# Patient Record
Sex: Male | Born: 1980 | Hispanic: No | Marital: Married | Smoking: Never smoker
Health system: Southern US, Community
[De-identification: ages and names within clinical notes are randomized; demographics above are authoritative.]

## PROBLEM LIST (undated history)

## (undated) DIAGNOSIS — J45909 Unspecified asthma, uncomplicated: Secondary | ICD-10-CM

## (undated) HISTORY — DX: Unspecified asthma, uncomplicated: J45.909

## (undated) HISTORY — PX: FEMUR FRACTURE SURGERY: SHX633

---

## 2017-09-09 ENCOUNTER — Encounter: Payer: Self-pay | Admitting: Nurse Practitioner

## 2017-09-09 ENCOUNTER — Ambulatory Visit (INDEPENDENT_AMBULATORY_CARE_PROVIDER_SITE_OTHER): Payer: Managed Care, Other (non HMO) | Admitting: Nurse Practitioner

## 2017-09-09 VITALS — BP 138/92 | HR 90 | Temp 98.1°F | Ht 69.0 in | Wt 265.0 lb

## 2017-09-09 DIAGNOSIS — R748 Abnormal levels of other serum enzymes: Secondary | ICD-10-CM

## 2017-09-09 DIAGNOSIS — I10 Essential (primary) hypertension: Secondary | ICD-10-CM | POA: Insufficient documentation

## 2017-09-09 DIAGNOSIS — Z6839 Body mass index (BMI) 39.0-39.9, adult: Secondary | ICD-10-CM

## 2017-09-09 DIAGNOSIS — R030 Elevated blood-pressure reading, without diagnosis of hypertension: Secondary | ICD-10-CM

## 2017-09-09 DIAGNOSIS — R7989 Other specified abnormal findings of blood chemistry: Secondary | ICD-10-CM

## 2017-09-09 DIAGNOSIS — E781 Pure hyperglyceridemia: Secondary | ICD-10-CM

## 2017-09-09 DIAGNOSIS — Z0001 Encounter for general adult medical examination with abnormal findings: Secondary | ICD-10-CM | POA: Diagnosis not present

## 2017-09-09 DIAGNOSIS — E559 Vitamin D deficiency, unspecified: Secondary | ICD-10-CM

## 2017-09-09 DIAGNOSIS — E118 Type 2 diabetes mellitus with unspecified complications: Secondary | ICD-10-CM

## 2017-09-09 DIAGNOSIS — E669 Obesity, unspecified: Secondary | ICD-10-CM

## 2017-09-09 LAB — COMPREHENSIVE METABOLIC PANEL
ALT: 82 U/L — AB (ref 0–53)
AST: 40 U/L — ABNORMAL HIGH (ref 0–37)
Albumin: 4.5 g/dL (ref 3.5–5.2)
Alkaline Phosphatase: 77 U/L (ref 39–117)
BILIRUBIN TOTAL: 0.8 mg/dL (ref 0.2–1.2)
BUN: 12 mg/dL (ref 6–23)
CO2: 31 meq/L (ref 19–32)
Calcium: 9 mg/dL (ref 8.4–10.5)
Chloride: 98 mEq/L (ref 96–112)
Creatinine, Ser: 0.93 mg/dL (ref 0.40–1.50)
GFR: 96.27 mL/min (ref >60.00)
GLUCOSE: 260 mg/dL — AB (ref 70–99)
Potassium: 4 mEq/L (ref 3.5–5.1)
SODIUM: 136 meq/L (ref 135–145)
TOTAL PROTEIN: 7.6 g/dL (ref 6.0–8.3)

## 2017-09-09 LAB — LIPID PANEL
Cholesterol: 152 mg/dL (ref 0–200)
HDL: 31.7 mg/dL — AB (ref >39.00)
NonHDL: 120.58
TRIGLYCERIDES: 328 mg/dL — AB (ref 0.0–149.0)
Total CHOL/HDL Ratio: 5
VLDL: 65.6 mg/dL — ABNORMAL HIGH (ref 0.0–40.0)

## 2017-09-09 LAB — TSH: TSH: 4.53 u[IU]/mL — ABNORMAL HIGH (ref 0.35–4.50)

## 2017-09-09 LAB — VITAMIN D 25 HYDROXY (VIT D DEFICIENCY, FRACTURES): VITD: 10.91 ng/mL — ABNORMAL LOW (ref 30.00–100.00)

## 2017-09-09 LAB — LDL CHOLESTEROL, DIRECT: LDL DIRECT: 81 mg/dL

## 2017-09-09 LAB — HEMOGLOBIN A1C: HEMOGLOBIN A1C: 9.1 % — AB (ref 4.6–6.5)

## 2017-09-09 NOTE — Patient Instructions (Addendum)
Check BP at home (once every morning) at record. Bring BP reading to next office visit.  Call office sooner if BP > 150/90 for more than 24hrs.  Maintain Dash diet and regular exercise for weight loss and to lower BP.  Continue to hold Ritalin at this time.  Labs indicates Diabetes with HgbA1c of 9.1. Need to start Metformin. Rx sent. TSH is  Mildly elevated. Need to repeat in 4weeks. Vitamin D is very low. Sent Rx for vitamin D supplement. CMP indicates normal renal function, but mild elevation in liver enzymes. Lipid panel indicates very elevated triglyceride. Sent fenofibrate Rx. Need to also maintain low fat and low carb diet, start regular exercise. Return to office as discussed  [name] stands for "Dietary Approaches to Stop Hypertension." The DASH eating plan is a healthy eating plan that has been shown to reduce high blood pressure (hypertension). It may also reduce your risk for type 2 diabetes, heart disease, and stroke. The DASH eating plan may also help with weight loss. What are tips for following this plan? General guidelines  Avoid eating more than 2,300 mg (milligrams) of salt (sodium) a day. If you have hypertension, you may need to reduce your sodium intake to 1,500 mg a day.  Limit alcohol intake to no more than 1 drink a day for nonpregnant women and 2 drinks a day for men. One drink equals 12 oz of beer, 5 oz of wine, or 1 oz of hard liquor.  Work with your health care provider to maintain a healthy body weight or to lose weight. Ask what an ideal weight is for you.  Get at least 30 minutes of exercise that causes your heart to beat faster (aerobic exercise) most days of the week. Activities may include walking, swimming, or biking.  Work with your health care provider or diet and nutrition specialist (dietitian) to adjust your eating plan to your individual calorie needs. Reading food labels  Check food labels for the amount of sodium per serving.  Choose foods with less than 5 percent of the Daily Value of sodium. Generally, foods with less than 300 mg of sodium per serving fit into this eating plan.  To find whole grains, look for the word "whole" as the first word in the ingredient list. Shopping  Buy products labeled as "low-sodium" or "no salt added."  Buy fresh foods. Avoid canned foods and premade or frozen meals. Cooking  Avoid adding salt when cooking. Use salt-free seasonings or herbs instead of table salt or sea salt. Check with your health care provider or pharmacist before using salt substitutes.  Do not fry foods. Cook foods using healthy methods such as baking, boiling, grilling, and broiling instead.  Cook with heart-healthy oils, such as olive, canola, soybean, or sunflower oil. Meal planning   Eat a balanced diet that includes: ? 5 or more servings of fruits and vegetables each day. At each meal, try to fill half of your plate with fruits and vegetables. ? Up to 6-8 servings of whole grains each day. ? Less than 6 oz of lean meat, poultry, or fish each day. A 3-oz serving of meat is about the same size as a deck of cards. One egg equals 1 oz. ? 2 servings of low-fat dairy each day. ? A serving of nuts, seeds, or beans 5 times each week. ? Heart-healthy fats. Healthy fats called Omega-3 fatty acids are found in foods such as flaxseeds and coldwater fish, like sardines, salmon,  and mackerel.  Limit how much you eat of the following: ? Canned or prepackaged foods. ? Food that is high in trans fat, such as fried foods. ? Food that is high in saturated fat, such as fatty meat. ? Sweets, desserts, sugary drinks, and other foods with added sugar. ? Full-fat dairy products.  Do not salt foods before eating.  Try to eat at least 2 vegetarian meals each week.  Eat more home-cooked food and less restaurant, buffet, and fast food.  When eating at a restaurant, ask that your food be prepared with less salt or no  salt, if possible. What foods are recommended? The items listed may not be a complete list. Talk with your dietitian about what dietary choices are best for you. Grains Whole-grain or whole-wheat bread. Whole-grain or whole-wheat pasta. Brown rice. [place]. Bulgur. Whole-grain and low-sodium cereals. Pita bread. Low-fat, low-sodium crackers. Whole-wheat flour tortillas. Vegetables Fresh or frozen vegetables (raw, steamed, roasted, or grilled). Low-sodium or reduced-sodium tomato and vegetable juice. Low-sodium or reduced-sodium tomato sauce and tomato paste. Low-sodium or reduced-sodium canned vegetables. Fruits All fresh, dried, or frozen fruit. Canned fruit in natural juice (without added sugar). Meat and other protein foods Skinless chicken or [place]. Ground chicken or [place]. Pork with fat trimmed off. Fish and seafood. Egg whites. Dried beans, peas, or lentils. Unsalted nuts, nut butters, and seeds. Unsalted canned beans. Lean cuts of beef with fat trimmed off. Low-sodium, lean deli meat. Dairy Low-fat (1%) or fat-free (skim) milk. Fat-free, low-fat, or reduced-fat cheeses. Nonfat, low-sodium ricotta or cottage cheese. Low-fat or nonfat yogurt. Low-fat, low-sodium cheese. Fats and oils Soft margarine without trans fats. Vegetable oil. Low-fat, reduced-fat, or light mayonnaise and salad dressings (reduced-sodium). Canola, safflower, olive, soybean, and sunflower oils. Avocado. Seasoning and other foods Herbs. Spices. Seasoning mixes without salt. Unsalted popcorn and pretzels. Fat-free sweets. What foods are not recommended? The items listed may not be a complete list. Talk with your dietitian about what dietary choices are best for you. Grains Baked goods made with fat, such as croissants, muffins, or some breads. Dry pasta or rice meal packs. Vegetables Creamed or fried vegetables. Vegetables in a cheese sauce. Regular canned vegetables (not low-sodium or reduced-sodium). Regular  canned tomato sauce and paste (not low-sodium or reduced-sodium). Regular tomato and vegetable juice (not low-sodium or reduced-sodium). [name]. Olives. Fruits Canned fruit in a light or heavy syrup. Fried fruit. Fruit in cream or butter sauce. Meat and other protein foods Fatty cuts of meat. Ribs. Fried meat. Berniece Salines. Sausage. Bologna and other processed lunch meats. Salami. Fatback. Hotdogs. Bratwurst. Salted nuts and seeds. Canned beans with added salt. Canned or smoked fish. Whole eggs or egg yolks. Chicken or [place] with skin. Dairy Whole or 2% milk, cream, and half-and-half. Whole or full-fat cream cheese. Whole-fat or sweetened yogurt. Full-fat cheese. Nondairy creamers. Whipped toppings. Processed cheese and cheese spreads. Fats and oils Butter. Stick margarine. Lard. Shortening. Ghee. Bacon fat. Tropical oils, such as coconut, palm kernel, or palm oil. Seasoning and other foods Salted popcorn and pretzels. Onion salt, garlic salt, seasoned salt, table salt, and sea salt. Worcestershire sauce. Tartar sauce. Barbecue sauce. Teriyaki sauce. Soy sauce, including reduced-sodium. Steak sauce. Canned and packaged gravies. Fish sauce. Oyster sauce. Cocktail sauce. Horseradish that you find on the shelf. Ketchup. Mustard. Meat flavorings and tenderizers. Bouillon cubes. Hot sauce and Tabasco sauce. Premade or packaged marinades. Premade or packaged taco seasonings. Relishes. Regular salad dressings. Where to find more information:  National Heart,  Lung, and Blood Institute: https://wilson-eaton.com/  American Heart Association: www.heart.org Summary  The DASH eating plan is a healthy eating plan that has been shown to reduce high blood pressure (hypertension). It may also reduce your risk for type 2 diabetes, heart disease, and stroke.  With the DASH eating plan, you should limit salt (sodium) intake to 2,300 mg a day. If you have hypertension, you may need to reduce your sodium intake to 1,500 mg a  day.  When on the DASH eating plan, aim to eat more fresh fruits and vegetables, whole grains, lean proteins, low-fat dairy, and heart-healthy fats.  Work with your health care provider or diet and nutrition specialist (dietitian) to adjust your eating plan to your individual calorie needs. This information is not intended to replace advice given to you by your health care provider. Make sure you discuss any questions you have with your health care provider. Document Released: 08/30/2011 Document Revised: 09/03/2016 Document Reviewed: 09/03/2016 Elsevier Interactive Patient Education  2017 [employer] 18-39 Years, Male Preventive care refers to lifestyle choices and visits with your health care provider that can promote health and wellness. What does preventive care include?  A yearly physical exam. This is also called an annual well check.  Dental exams once or twice a year.  Routine eye exams. Ask your health care provider how often you should have your eyes checked.  Personal lifestyle choices, including: ? Daily care of your teeth and gums. ? Regular physical activity. ? Eating a healthy diet. ? Avoiding tobacco and drug use. ? Limiting alcohol use. ? Practicing safe sex. What happens during an annual well check? The services and screenings done by your health care provider during your annual well check will depend on your age, overall health, lifestyle risk factors, and family history of disease. Counseling Your health care provider may ask you questions about your:  Alcohol use.  Tobacco use.  Drug use.  Emotional well-being.  Home and relationship well-being.  Sexual activity.  Eating habits.  Work and work Statistician.  Screening You may have the following tests or measurements:  Height, weight, and BMI.  Blood pressure.  Lipid and cholesterol levels. These may be checked every 5 years starting at age 67.  Diabetes screening. This  is done by checking your blood sugar (glucose) after you have not eaten for a while (fasting).  Skin check.  Hepatitis C blood test.  Hepatitis B blood test.  Sexually transmitted disease (STD) testing.  Discuss your test results, treatment options, and if necessary, the need for more tests with your health care provider. Vaccines Your health care provider may recommend certain vaccines, such as:  Influenza vaccine. This is recommended every year.  Tetanus, diphtheria, and acellular pertussis (Tdap, Td) vaccine. You may need a Td booster every 10 years.  Varicella vaccine. You may need this if you have not been vaccinated.  HPV vaccine. If you are 50 or younger, you may need three doses over 6 months.  Measles, mumps, and rubella (MMR) vaccine. You may need at least one dose of MMR.You may also need a second dose.  Pneumococcal 13-valent conjugate (PCV13) vaccine. You may need this if you have certain conditions and have not been vaccinated.  Pneumococcal polysaccharide (PPSV23) vaccine. You may need one or two doses if you smoke cigarettes or if you have certain conditions.  Meningococcal vaccine. One dose is recommended if you are age 51-21 years and a Market researcher living in  a residence hall, or if you have one of several medical conditions. You may also need additional booster doses.  Hepatitis A vaccine. You may need this if you have certain conditions or if you travel or work in places where you may be exposed to hepatitis A.  Hepatitis B vaccine. You may need this if you have certain conditions or if you travel or work in places where you may be exposed to hepatitis B.  Haemophilus influenzae type b (Hib) vaccine. You may need this if you have certain risk factors.  Talk to your health care provider about which screenings and vaccines you need and how often you need them. This information is not intended to replace advice given to you by your health care  provider. Make sure you discuss any questions you have with your health care provider. Document Released: 11/06/2001 Document Revised: 05/30/2016 Document Reviewed: 07/12/2015 Elsevier Interactive Patient Education  2017 [employer].

## 2017-09-09 NOTE — Progress Notes (Signed)
Subjective:    Patient ID: Tristan Short, male    DOB: Aug 30, 1981, 36 y.o.   MRN: [id number]  Patient presents today for complete physical and evaluation of BP.  HPI  Mr. Zaivion reports elevated BP readings by psychiatrist: Dr. Elige RadonBradley in [place], [place]. Ritalin has not been refilled since 04/2017 due to BP reading of 170/90 at psychiatrist office. Last home readings of 130s/80s per patient.  Immunizations: (TDAP, Hep C screen, Pneumovax, Influenza, zoster)  Health Maintenance  Topic Date Due  . Pneumococcal vaccine (1) 12/14/1980  . Complete foot exam   12/14/1988  . Eye exam for diabetics  12/14/1988  . Flu Shot  05/12/2018*  . HIV Screening  09/09/2018*  . Hemoglobin A1C  03/10/2018  . Tetanus Vaccine  02/22/2025  *Topic was postponed. The date shown is not the original due date.   Diet:low sodium  Weight:  Wt Readings from Last 3 Encounters:  09/09/17 265 lb (120.2 kg)    Exercise:none.  Fall Risk: Fall Risk  09/09/2017  Falls in the past year? No   Home Safety:home with wife and dogs.  Depression/Suicide: [facility] 09/09/2017 09/09/2017  Decreased Interest 1 1  Down, Depressed, Hopeless 0 1  PHQ - 2 Score 1 2  Altered sleeping 3 -  Tired, decreased energy 2 -  Change in appetite 3 -  Feeling bad or failure about yourself  0 -  Trouble concentrating 3 -  Moving slowly or fidgety/restless 0 -  Suicidal thoughts 0 -  PHQ-9 Score 12 -   Vision:needed.  Dental:needed.  Sexual History (birth control, marital status, STD):married and sexually active.  Medications and allergies reviewed with patient and updated if appropriate.  Patient Active Problem List   Diagnosis Date Noted  . Vitamin D deficiency 09/10/2017  . Type 2 diabetes mellitus with complication, without long-term current use of insulin (HCC) 09/10/2017  . Hypertriglyceridemia 09/10/2017  . Class 2 obesity without serious comorbidity with body mass index (BMI) of 39.0 to  39.9 in adult 09/10/2017  . Abnormal TSH 09/10/2017  . Elevated liver enzymes 09/10/2017  . Elevated BP without diagnosis of hypertension 09/09/2017    Current Outpatient Medications on File Prior to Visit  Medication Sig Dispense Refill  . methylphenidate (RITALIN LA) 40 MG 24 hr capsule Take 40 mg by mouth every morning.     No current facility-administered medications on file prior to visit.     Past Medical History:  Diagnosis Date  . Asthma     Social History   Socioeconomic History  . Marital status: Married    Spouse name: None  . Number of children: 0  . Years of education: BSN  . Highest education level: None  Social Needs  . Financial resource strain: None  . Food insecurity - worry: None  . Food insecurity - inability: None  . Transportation needs - medical: None  . Transportation needs - non-medical: None  Occupational History  . Occupation: Chief Financial OfficerTelecom Engineer    Employer: [employer]  Tobacco Use  . Smoking status: Never Smoker  . Smokeless tobacco: Never Used  Substance and Sexual Activity  . Alcohol use: No    Frequency: Never  . Drug use: No  . Sexual activity: Yes  Other Topics Concern  . None  Social History Narrative  . None    Family History  Adopted: Yes  Family history unknown: Yes        Review of Systems  Constitutional: Negative for  fever, malaise/fatigue and weight loss.  HENT: Negative for congestion and sore throat.   Eyes:       Negative for visual changes  Respiratory: Negative for cough and shortness of breath.   Cardiovascular: Negative for chest pain, palpitations and leg swelling.  Gastrointestinal: Negative for blood in stool, constipation, diarrhea and heartburn.  Genitourinary: Negative for dysuria, frequency and urgency.  Musculoskeletal: Negative for falls, joint pain and myalgias.  Skin: Negative for rash.  Neurological: Negative for dizziness, sensory change and headaches.  Endo/Heme/Allergies: Does not  bruise/bleed easily.  Psychiatric/Behavioral: Negative for depression, substance abuse and suicidal ideas. The patient is not nervous/anxious.     Objective:   Vitals:   09/09/17 1024  BP: (!) 138/92  Pulse: 90  Temp: 98.1 F (36.7 C)  SpO2: 98%    Body mass index is 39.13 kg/m.   Physical Examination:  Physical Exam  Constitutional: He is oriented to person, place, and time and well-developed, well-nourished, and in no distress. No distress.  HENT:  Right Ear: External ear normal.  Left Ear: External ear normal.  Nose: Nose normal.  Mouth/Throat: Oropharynx is clear and moist. No oropharyngeal exudate.  Eyes: Conjunctivae and EOM are normal. Pupils are equal, round, and reactive to light. No scleral icterus.  Neck: Normal range of motion. Neck supple. No thyromegaly present.  Cardiovascular: Normal rate, normal heart sounds and intact distal pulses.  Pulmonary/Chest: Effort normal and breath sounds normal. He exhibits no tenderness.  Abdominal: Soft. Bowel sounds are normal. He exhibits no distension. There is no tenderness.  Musculoskeletal: Normal range of motion. He exhibits no edema or tenderness.  Lymphadenopathy:    He has no cervical adenopathy.  Neurological: He is alert and oriented to person, place, and time. Gait normal.  Skin: Skin is warm and dry.  Psychiatric: Affect and judgment normal.    ASSESSMENT and PLAN:  [place] was seen today for establish care.  Diagnoses and all orders for this visit:  Encounter for preventative adult health care exam with abnormal findings -     Hemoglobin A1c -     Comprehensive metabolic panel -     TSH -     Lipid panel  Elevated BP without diagnosis of hypertension  Hypertriglyceridemia -     Lipid panel -     LDL cholesterol, direct -     fenofibrate (TRICOR) 145 MG tablet; Take 1 tablet (145 mg total) by mouth daily.  Class 2 obesity without serious comorbidity with body mass index (BMI) of 39.0 to 39.9 in  adult, unspecified obesity type -     Hemoglobin A1c -     Vitamin D (25 hydroxy)  Type 2 diabetes mellitus with complication, without long-term current use of insulin (HCC) -     metFORMIN (GLUCOPHAGE) 500 MG tablet; Take 1 tablet (500 mg total) by mouth 2 (two) times daily with a meal. -     glyBURIDE (DIABETA) 2.5 MG tablet; Take 1 tablet (2.5 mg total) by mouth daily with breakfast.  Vitamin D deficiency -     Vitamin D (25 hydroxy) -     Vitamin D, Ergocalciferol, (DRISDOL) 50000 units CAPS capsule; Take 1 capsule (50,000 Units total) by mouth every 7 (seven) days.  Abnormal TSH  Elevated liver enzymes   No problem-specific Assessment & Plan notes found for this encounter.     Follow up: Return in about 1 week (around 09/16/2017) for BP re eval and DM, need repeat TSH and free  t4.  Alysia [place] Omer Monter, NP

## 2017-09-10 ENCOUNTER — Encounter: Payer: Self-pay | Admitting: Nurse Practitioner

## 2017-09-10 DIAGNOSIS — Z6839 Body mass index (BMI) 39.0-39.9, adult: Secondary | ICD-10-CM

## 2017-09-10 DIAGNOSIS — R748 Abnormal levels of other serum enzymes: Secondary | ICD-10-CM | POA: Insufficient documentation

## 2017-09-10 DIAGNOSIS — R7989 Other specified abnormal findings of blood chemistry: Secondary | ICD-10-CM | POA: Insufficient documentation

## 2017-09-10 DIAGNOSIS — E118 Type 2 diabetes mellitus with unspecified complications: Secondary | ICD-10-CM | POA: Insufficient documentation

## 2017-09-10 DIAGNOSIS — E669 Obesity, unspecified: Secondary | ICD-10-CM | POA: Insufficient documentation

## 2017-09-10 DIAGNOSIS — E559 Vitamin D deficiency, unspecified: Secondary | ICD-10-CM | POA: Insufficient documentation

## 2017-09-10 DIAGNOSIS — E781 Pure hyperglyceridemia: Secondary | ICD-10-CM | POA: Insufficient documentation

## 2017-09-10 MED ORDER — VITAMIN D (ERGOCALCIFEROL) 1.25 MG (50000 UNIT) PO CAPS: 50000.0000 [IU] | ORAL_CAPSULE | ORAL | 0 refills | Status: DC

## 2017-09-10 MED ORDER — GLYBURIDE 2.5 MG PO TABS: 2.5000 mg | ORAL_TABLET | Freq: Every day | ORAL | 1 refills | Status: DC

## 2017-09-10 MED ORDER — FENOFIBRATE 145 MG PO TABS: 145.0000 mg | ORAL_TABLET | Freq: Every day | ORAL | 0 refills | Status: DC

## 2017-09-10 MED ORDER — METFORMIN HCL 500 MG PO TABS: 500.0000 mg | ORAL_TABLET | Freq: Two times a day (BID) | ORAL | 1 refills | Status: DC

## 2017-09-19 ENCOUNTER — Ambulatory Visit: Payer: Managed Care, Other (non HMO) | Admitting: Nurse Practitioner

## 2017-09-19 ENCOUNTER — Encounter: Payer: Self-pay | Admitting: Nurse Practitioner

## 2017-09-19 VITALS — BP 114/80 | HR 71 | Temp 97.5°F | Ht 69.0 in | Wt 264.0 lb

## 2017-09-19 DIAGNOSIS — Z6839 Body mass index (BMI) 39.0-39.9, adult: Secondary | ICD-10-CM

## 2017-09-19 DIAGNOSIS — R030 Elevated blood-pressure reading, without diagnosis of hypertension: Secondary | ICD-10-CM

## 2017-09-19 DIAGNOSIS — E669 Obesity, unspecified: Secondary | ICD-10-CM

## 2017-09-19 DIAGNOSIS — R040 Epistaxis: Secondary | ICD-10-CM

## 2017-09-19 MED ORDER — SALINE SPRAY 0.65 % NA SOLN: 1.0000 | NASAL | 0 refills | Status: DC | PRN

## 2017-09-19 MED ORDER — OXYMETAZOLINE HCL 0.05 % NA SOLN: 1.0000 | Freq: Two times a day (BID) | NASAL | 0 refills | Status: DC | PRN

## 2017-09-19 NOTE — Progress Notes (Signed)
Subjective:  Patient ID: Tristan Short, male    DOB: 1981/03/06  Age: 36 y.o. MRN: [id number]  CC: Follow-up (follow up/fasting)   HPI Elevated BP:  home BP readings:138/96 -125/82. Denies any acute symptoms  Elevated AST and ALT: Reports hx of fatty liver. Diagnosed 6770yrs ago via ABD US. Does not remember if any other diagnostics were done.  Denies any adverse side effects with new medications(metformin, glyburide, fenofibrate, and vitamin D)  Outpatient Medications Prior to Visit  Medication Sig Dispense Refill  . fenofibrate (TRICOR) 145 MG tablet Take 1 tablet (145 mg total) by mouth daily. 30 tablet 0  . glyBURIDE (DIABETA) 2.5 MG tablet Take 1 tablet (2.5 mg total) by mouth daily with breakfast. 30 tablet 1  . metFORMIN (GLUCOPHAGE) 500 MG tablet Take 1 tablet (500 mg total) by mouth 2 (two) times daily with a meal. 60 tablet 1  . Vitamin D, Ergocalciferol, (DRISDOL) 50000 units CAPS capsule Take 1 capsule (50,000 Units total) by mouth every 7 (seven) days. 12 capsule 0  . methylphenidate (RITALIN LA) 40 MG 24 hr capsule Take 40 mg by mouth every morning.     No facility-administered medications prior to visit.     ROS Review of Systems  Constitutional: Negative for malaise/fatigue.  Cardiovascular: Negative for chest pain and palpitations.  Gastrointestinal: Negative for diarrhea and nausea.  Genitourinary: Negative.   Musculoskeletal: Negative for joint pain and myalgias.  Skin: Negative.   Neurological: Negative for dizziness and headaches.    Objective:  BP 114/80   Pulse 71   Temp (!) 97.5 F (36.4 C)   Ht 5\' 9"  (1.753 m)   Wt 264 lb (119.7 kg)   SpO2 97%   BMI 38.99 kg/m   BP Readings from Last 3 Encounters:  09/19/17 114/80  09/09/17 (!) 138/92    Wt Readings from Last 3 Encounters:  09/19/17 264 lb (119.7 kg)  09/09/17 265 lb (120.2 kg)    Physical Exam  Constitutional: He is oriented to person, place, and time. No distress.  HENT:    Right Ear: Tympanic membrane, external ear and ear canal normal.  Left Ear: Tympanic membrane, external ear and ear canal normal.  Nose: Mucosal edema present. No rhinorrhea or nasal septal hematoma. No epistaxis.  No foreign bodies. Right sinus exhibits no maxillary sinus tenderness and no frontal sinus tenderness. Left sinus exhibits no maxillary sinus tenderness and no frontal sinus tenderness.  Mouth/Throat: Uvula is midline and oropharynx is clear and moist.  Cardiovascular: Normal rate.  Pulmonary/Chest: Effort normal.  Musculoskeletal: He exhibits no edema.  Neurological: He is alert and oriented to person, place, and time.  Skin: Skin is warm and dry.  Psychiatric: He has a normal mood and affect. His behavior is normal. Thought content normal.  Vitals reviewed.   Lab Results  Component Value Date   GLUCOSE 260 (H) 09/09/2017   CHOL 152 09/09/2017   TRIG 328.0 (H) 09/09/2017   HDL 31.70 (L) 09/09/2017   LDLDIRECT 81.0 09/09/2017   ALT 82 (H) 09/09/2017   AST 40 (H) 09/09/2017   NA 136 09/09/2017   K 4.0 09/09/2017   CL 98 09/09/2017   CREATININE 0.93 09/09/2017   BUN 12 09/09/2017   CO2 31 09/09/2017   TSH 4.53 (H) 09/09/2017   HGBA1C 9.1 (H) 09/09/2017    Patient was never admitted.  Assessment & Plan:   Tristan Short was seen today for follow-up.  Diagnoses and all orders for this visit:  Class  2 obesity without serious comorbidity with body mass index (BMI) of 39.0 to 39.9 in adult, unspecified obesity type  Elevated BP without diagnosis of hypertension  Acute anterior epistaxis -     sodium chloride (OCEAN) 0.65 % SOLN nasal spray; Place 1 spray into both nostrils as needed for congestion. -     oxymetazoline (AFRIN NASAL SPRAY) 0.05 % nasal spray; Place 1 spray into both nostrils 2 (two) times daily as needed for congestion. For nosebleeds   I am having [facility] start on sodium chloride and oxymetazoline. I am also having him maintain his  methylphenidate, metFORMIN, fenofibrate, glyBURIDE, and Vitamin D (Ergocalciferol).  Meds ordered this encounter  Medications  . sodium chloride (OCEAN) 0.65 % SOLN nasal spray    Sig: Place 1 spray into both nostrils as needed for congestion.    Dispense:  15 mL    Refill:  0    Order Specific Question:   Supervising Provider    Answer:   Dianne DunARON, TALIA M [[id number]]  . oxymetazoline (AFRIN NASAL SPRAY) 0.05 % nasal spray    Sig: Place 1 spray into both nostrils 2 (two) times daily as needed for congestion. For nosebleeds    Dispense:  30 mL    Refill:  0    Order Specific Question:   Supervising Provider    Answer:   Dianne DunARON, TALIA M [[id number]]    Follow-up: Return in about 4 weeks (around 10/17/2017) for BP re eval, TSH, vitamin d and liver enzymes.Alysia Penna.  Monica Codd, NP

## 2017-10-17 ENCOUNTER — Ambulatory Visit: Payer: Managed Care, Other (non HMO) | Admitting: Nurse Practitioner

## 2017-10-18 ENCOUNTER — Encounter: Payer: Self-pay | Admitting: Nurse Practitioner

## 2017-10-18 ENCOUNTER — Ambulatory Visit: Payer: Managed Care, Other (non HMO) | Admitting: Nurse Practitioner

## 2017-10-18 VITALS — BP 142/90 | HR 85 | Temp 97.7°F | Ht 69.0 in | Wt 263.0 lb

## 2017-10-18 DIAGNOSIS — R748 Abnormal levels of other serum enzymes: Secondary | ICD-10-CM

## 2017-10-18 DIAGNOSIS — R7989 Other specified abnormal findings of blood chemistry: Secondary | ICD-10-CM | POA: Diagnosis not present

## 2017-10-18 DIAGNOSIS — Z6839 Body mass index (BMI) 39.0-39.9, adult: Secondary | ICD-10-CM

## 2017-10-18 DIAGNOSIS — E118 Type 2 diabetes mellitus with unspecified complications: Secondary | ICD-10-CM | POA: Diagnosis not present

## 2017-10-18 DIAGNOSIS — E559 Vitamin D deficiency, unspecified: Secondary | ICD-10-CM | POA: Diagnosis not present

## 2017-10-18 DIAGNOSIS — E669 Obesity, unspecified: Secondary | ICD-10-CM

## 2017-10-18 DIAGNOSIS — E781 Pure hyperglyceridemia: Secondary | ICD-10-CM | POA: Diagnosis not present

## 2017-10-18 DIAGNOSIS — J0140 Acute pansinusitis, unspecified: Secondary | ICD-10-CM

## 2017-10-18 DIAGNOSIS — R030 Elevated blood-pressure reading, without diagnosis of hypertension: Secondary | ICD-10-CM | POA: Diagnosis not present

## 2017-10-18 LAB — HEPATIC FUNCTION PANEL
ALT: 56 U/L — AB (ref 0–53)
AST: 38 U/L — AB (ref 0–37)
Albumin: 4.5 g/dL (ref 3.5–5.2)
Alkaline Phosphatase: 61 U/L (ref 39–117)
BILIRUBIN DIRECT: 0.1 mg/dL (ref 0.0–0.3)
BILIRUBIN TOTAL: 0.4 mg/dL (ref 0.2–1.2)
Total Protein: 7.6 g/dL (ref 6.0–8.3)

## 2017-10-18 LAB — LIPID PANEL
CHOLESTEROL: 149 mg/dL (ref 0–200)
HDL: 32.9 mg/dL — ABNORMAL LOW (ref >39.00)
LDL CALC: 83 mg/dL (ref 0–99)
NonHDL: 115.92
Total CHOL/HDL Ratio: 5
Triglycerides: 166 mg/dL — ABNORMAL HIGH (ref 0.0–149.0)
VLDL: 33.2 mg/dL (ref 0.0–40.0)

## 2017-10-18 LAB — VITAMIN D 25 HYDROXY (VIT D DEFICIENCY, FRACTURES): VITD: 30.36 ng/mL (ref 30.00–100.00)

## 2017-10-18 LAB — TSH: TSH: 3.68 u[IU]/mL (ref 0.35–4.50)

## 2017-10-18 LAB — T4, FREE: Free T4: 0.96 ng/dL (ref 0.60–1.60)

## 2017-10-18 MED ORDER — GUAIFENESIN ER 600 MG PO TB12: 600.0000 mg | ORAL_TABLET | Freq: Two times a day (BID) | ORAL | 0 refills | Status: DC | PRN

## 2017-10-18 MED ORDER — ONETOUCH ULTRASOFT LANCETS MISC: 3 refills | Status: DC

## 2017-10-18 MED ORDER — METFORMIN HCL ER 750 MG PO TB24: 750.0000 mg | ORAL_TABLET | Freq: Every day | ORAL | 1 refills | Status: DC

## 2017-10-18 MED ORDER — AZITHROMYCIN 250 MG PO TABS: 250.0000 mg | ORAL_TABLET | Freq: Every day | ORAL | 0 refills | Status: DC

## 2017-10-18 MED ORDER — GLUCOSE BLOOD VI STRP: ORAL_STRIP | 3 refills | Status: DC

## 2017-10-18 NOTE — Patient Instructions (Addendum)
You will be called to schedule appt with edocrinology.  Check BP at home 3-4times a week and record. Return to office sooner if BP persistently >150/80.  Take glucose readings to appt with endocrinology appt.  Stable liver function with persistent elevation in AST and ALT. Avoid use of ETOH and acetaminophen. With hx of fatty liver, I will recommend CT ABD for further evaluation. Let me know he agrees. Lipid panel indicates Improved triglyceride 328 to 166. Continue current medication. Improved vitamin D 10.91 to 30.36, continue current supplement. Normal thyroid function.

## 2017-10-18 NOTE — Progress Notes (Signed)
Subjective:  Patient ID: Tristan Short, male    DOB: Jun 08, 1981  Age: 37 y.o. MRN: [id number]  CC: Follow-up (follow up BP re eval, TSH, vitamin d and liver enzymes.); Weight Loss (weight loss consult); and Sore Throat (sore throat, green mucus--2 days ago?)  Sinusitis  This is a new problem. The current episode started 1 to 4 weeks ago. The problem has been waxing and waning since onset. There has been no fever. Associated symptoms include congestion, coughing, headaches, a hoarse voice, sinus pressure, a sore throat and swollen glands. Pertinent negatives include no chills or shortness of breath. Past treatments include oral decongestants and saline sprays. The treatment provided no relief.   DM: Does not check glucose at home. Need rx for one touch strips and lancets. Will like referral to endocrinology and for metformin to be changed to XR. He has made changed to diet (decreased carb and sugar consumption). No exercise at this time.  Elevated BP: Resumed ritalin 30mg  by psychiatry. BP Readings from Last 3 Encounters:  10/18/17 (!) 142/90  09/19/17 114/80  09/09/17 (!) 138/92   Outpatient Medications Prior to Visit  Medication Sig Dispense Refill  . methylphenidate (RITALIN LA) 30 MG 24 hr capsule   0  . oxymetazoline (AFRIN NASAL SPRAY) 0.05 % nasal spray Place 1 spray into both nostrils 2 (two) times daily as needed for congestion. For nosebleeds 30 mL 0  . sodium chloride (OCEAN) 0.65 % SOLN nasal spray Place 1 spray into both nostrils as needed for congestion. 15 mL 0  . fenofibrate (TRICOR) 145 MG tablet Take 1 tablet (145 mg total) by mouth daily. 30 tablet 0  . glyBURIDE (DIABETA) 2.5 MG tablet Take 1 tablet (2.5 mg total) by mouth daily with breakfast. 30 tablet 1  . metFORMIN (GLUCOPHAGE) 500 MG tablet Take 1 tablet (500 mg total) by mouth 2 (two) times daily with a meal. 60 tablet 1  . methylphenidate (RITALIN LA) 40 MG 24 hr capsule Take 40 mg by mouth every morning.     . Vitamin D, Ergocalciferol, (DRISDOL) 50000 units CAPS capsule Take 1 capsule (50,000 Units total) by mouth every 7 (seven) days. 12 capsule 0   No facility-administered medications prior to visit.     ROS See HPI  Objective:  BP (!) 142/90   Pulse 85   Temp 97.7 F (36.5 C)   Ht 5\' 9"  (1.753 m)   Wt 263 lb (119.3 kg)   SpO2 98%   BMI 38.84 kg/m   BP Readings from Last 3 Encounters:  10/18/17 (!) 142/90  09/19/17 114/80  09/09/17 (!) 138/92    Wt Readings from Last 3 Encounters:  10/18/17 263 lb (119.3 kg)  09/19/17 264 lb (119.7 kg)  09/09/17 265 lb (120.2 kg)    Physical Exam  Constitutional: He is oriented to person, place, and time.  Cardiovascular: Normal rate and regular rhythm.  Pulmonary/Chest: Effort normal and breath sounds normal.  Abdominal: Soft. Bowel sounds are normal.  Musculoskeletal: He exhibits no edema.  Neurological: He is alert and oriented to person, place, and time.  Vitals reviewed.   Lab Results  Component Value Date   GLUCOSE 260 (H) 09/09/2017   CHOL 149 10/18/2017   TRIG 166.0 (H) 10/18/2017   HDL 32.90 (L) 10/18/2017   LDLDIRECT 81.0 09/09/2017   LDLCALC 83 10/18/2017   ALT 56 (H) 10/18/2017   AST 38 (H) 10/18/2017   NA 136 09/09/2017   K 4.0 09/09/2017   CL  98 09/09/2017   CREATININE 0.93 09/09/2017   BUN 12 09/09/2017   CO2 31 09/09/2017   TSH 3.68 10/18/2017   HGBA1C 9.1 (H) 09/09/2017    Assessment & Plan:   Maxwel was seen today for follow-up, weight loss and sore throat.  Diagnoses and all orders for this visit:  Abnormal TSH -     TSH -     T4, free  Type 2 diabetes mellitus with complication, without long-term current use of insulin (HCC) -     glucose blood test strip; One Touch strips: Check glucose before breakfast and dinner. -     Lancets (ONETOUCH ULTRASOFT) lancets; Check glucose before breakfast and dinner. -     Ambulatory referral to Endocrinology -     metFORMIN (GLUCOPHAGE-XR) 750 MG 24  hr tablet; Take 1 tablet (750 mg total) by mouth daily with breakfast. -     glyBURIDE (DIABETA) 2.5 MG tablet; Take 1 tablet (2.5 mg total) by mouth daily with breakfast.  Class 2 obesity without serious comorbidity with body mass index (BMI) of 39.0 to 39.9 in adult, unspecified obesity type -     Ambulatory referral to Endocrinology  Elevated liver enzymes -     Hepatic function panel  Vitamin D deficiency -     VITAMIN D 25 Hydroxy (Vit-D Deficiency, Fractures) -     Cholecalciferol (VITAMIN D) 2000 units tablet; Take 1 tablet (2,000 Units total) by mouth daily.  Hypertriglyceridemia -     Hepatic function panel -     Lipid panel -     fenofibrate (TRICOR) 145 MG tablet; Take 1 tablet (145 mg total) by mouth daily.  Acute non-recurrent pansinusitis -     azithromycin (ZITHROMAX Z-PAK) 250 MG tablet; Take 1 tablet (250 mg total) by mouth daily. Take 2tabs on first day, then 1tab once a day till complete -     guaiFENesin (MUCINEX) 600 MG 12 hr tablet; Take 1 tablet (600 mg total) by mouth 2 (two) times daily as needed for cough or to loosen phlegm.  Elevated BP without diagnosis of hypertension   I have discontinued Nial Siller's metFORMIN and Vitamin D (Ergocalciferol). I am also having him start on glucose blood, onetouch ultrasoft, metFORMIN, azithromycin, guaiFENesin, and Vitamin D. Additionally, I am having him maintain his sodium chloride, oxymetazoline, methylphenidate, fenofibrate, and glyBURIDE.  Meds ordered this encounter  Medications  . glucose blood test strip    Sig: One Touch strips: Check glucose before breakfast and dinner.    Dispense:  100 each    Refill:  3    Order Specific Question:   Supervising Provider    Answer:   Dianne Dun [[id number]]  . Lancets (ONETOUCH ULTRASOFT) lancets    Sig: Check glucose before breakfast and dinner.    Dispense:  100 each    Refill:  3    Order Specific Question:   Supervising Provider    Answer:   Dianne Dun  [[id number]]  . metFORMIN (GLUCOPHAGE-XR) 750 MG 24 hr tablet    Sig: Take 1 tablet (750 mg total) by mouth daily with breakfast.    Dispense:  90 tablet    Refill:  1    Order Specific Question:   Supervising Provider    Answer:   Dianne Dun [[id number]]  . azithromycin (ZITHROMAX Z-PAK) 250 MG tablet    Sig: Take 1 tablet (250 mg total) by mouth daily. Take 2tabs on first day, then 1tab once a  day till complete    Dispense:  6 tablet    Refill:  0    Order Specific Question:   Supervising Provider    Answer:   Dianne Dun [[id number]]  . guaiFENesin (MUCINEX) 600 MG 12 hr tablet    Sig: Take 1 tablet (600 mg total) by mouth 2 (two) times daily as needed for cough or to loosen phlegm.    Dispense:  14 tablet    Refill:  0    Order Specific Question:   Supervising Provider    Answer:   Dianne Dun [[id number]]  . Cholecalciferol (VITAMIN D) 2000 units tablet    Sig: Take 1 tablet (2,000 Units total) by mouth daily.    Dispense:  30 tablet    Refill:  2    Order Specific Question:   Supervising Provider    Answer:   Dianne Dun [[id number]]  . fenofibrate (TRICOR) 145 MG tablet    Sig: Take 1 tablet (145 mg total) by mouth daily.    Dispense:  90 tablet    Refill:  0    Order Specific Question:   Supervising Provider    Answer:   Dianne Dun [[id number]]  . glyBURIDE (DIABETA) 2.5 MG tablet    Sig: Take 1 tablet (2.5 mg total) by mouth daily with breakfast.    Dispense:  90 tablet    Refill:  1    Order Specific Question:   Supervising Provider    Answer:   Dianne Dun [[id number]]    Follow-up: Return in about 3 months (around 01/16/2018) for DM and HTN, hyperlipidemia.Alysia Penna, NP

## 2017-10-21 ENCOUNTER — Encounter: Payer: Self-pay | Admitting: Nurse Practitioner

## 2017-10-21 DIAGNOSIS — K760 Fatty (change of) liver, not elsewhere classified: Secondary | ICD-10-CM

## 2017-10-21 MED ORDER — VITAMIN D 50 MCG (2000 UT) PO TABS: 2000.0000 [IU] | ORAL_TABLET | Freq: Every day | ORAL | 2 refills | Status: DC

## 2017-10-21 MED ORDER — FENOFIBRATE 145 MG PO TABS: 145.0000 mg | ORAL_TABLET | Freq: Every day | ORAL | 0 refills | Status: DC

## 2017-10-21 MED ORDER — GLYBURIDE 2.5 MG PO TABS: 2.5000 mg | ORAL_TABLET | Freq: Every day | ORAL | 1 refills | Status: DC

## 2017-10-21 NOTE — Assessment & Plan Note (Signed)
Elevated BP due to use ritalin. BP Readings from Last 3 Encounters:  10/18/17 (!) 142/90  09/19/17 114/80  09/09/17 (!) 138/92   Advised patient to monitor BP at home

## 2017-10-21 NOTE — Assessment & Plan Note (Signed)
New diagnosis. HgbA1c of 9.1. Current use of metformin and glyburide.

## 2017-10-21 NOTE — Assessment & Plan Note (Signed)
Improved from 10.91 to 30 with 50,000IU weekly Decreased dose to 2000IU once a day. Repeat in 6months

## 2017-10-21 NOTE — Assessment & Plan Note (Signed)
Improved triglyceride with fenofibrate. Consider changing to statin in 3months.  Lipid Panel     Component Value Date/Time   CHOL 149 10/18/2017 0911   TRIG 166.0 (H) 10/18/2017 0911   HDL 32.90 (L) 10/18/2017 0911   CHOLHDL 5 10/18/2017 0911   VLDL 33.2 10/18/2017 0911   LDLCALC 83 10/18/2017 0911   LDLDIRECT 81.0 09/09/2017 1123

## 2017-10-21 NOTE — Assessment & Plan Note (Signed)
He reports hx of fatty liver. Does not remember if hepatitis panel was done. Need CT ABD, iron panel and hepatitis B&C panel. CMP     Component Value Date/Time   NA 136 09/09/2017 1123   K 4.0 09/09/2017 1123   CL 98 09/09/2017 1123   CO2 31 09/09/2017 1123   GLUCOSE 260 (H) 09/09/2017 1123   BUN 12 09/09/2017 1123   CREATININE 0.93 09/09/2017 1123   CALCIUM 9.0 09/09/2017 1123   PROT 7.6 10/18/2017 0911   ALBUMIN 4.5 10/18/2017 0911   AST 38 (H) 10/18/2017 0911   ALT 56 (H) 10/18/2017 0911   ALKPHOS 61 10/18/2017 0911   BILITOT 0.4 10/18/2017 0911

## 2017-10-22 NOTE — Telephone Encounter (Signed)
-----   Message from Livingston DionesPhetcharat Noitamyae, LPN sent at 1/61/09601/29/2019  4:18 PM EST ----- Please help enter CT order.

## 2017-10-28 ENCOUNTER — Other Ambulatory Visit: Payer: Self-pay | Admitting: Nurse Practitioner

## 2017-10-28 DIAGNOSIS — R748 Abnormal levels of other serum enzymes: Secondary | ICD-10-CM

## 2017-11-08 ENCOUNTER — Ambulatory Visit
Admission: RE | Admit: 2017-11-08 | Discharge: 2017-11-08 | Disposition: A | Discharge status: Home / Self Care | Payer: Managed Care, Other (non HMO) | Source: Ambulatory Visit | Attending: Nurse Practitioner | Admitting: Nurse Practitioner

## 2017-11-08 DIAGNOSIS — R748 Abnormal levels of other serum enzymes: Secondary | ICD-10-CM

## 2017-11-19 ENCOUNTER — Encounter: Payer: Self-pay | Admitting: Endocrinology

## 2017-11-19 ENCOUNTER — Ambulatory Visit: Payer: Managed Care, Other (non HMO) | Admitting: Endocrinology

## 2017-11-19 DIAGNOSIS — E118 Type 2 diabetes mellitus with unspecified complications: Secondary | ICD-10-CM | POA: Diagnosis not present

## 2017-11-19 LAB — POCT GLYCOSYLATED HEMOGLOBIN (HGB A1C): Hemoglobin A1C: 6.3

## 2017-11-19 MED ORDER — EMPAGLIFLOZIN 10 MG PO TABS: 10.0000 mg | ORAL_TABLET | Freq: Every day | ORAL | 3 refills | Status: DC

## 2017-11-19 MED ORDER — METFORMIN HCL ER 750 MG PO TB24: 1500.0000 mg | ORAL_TABLET | Freq: Every day | ORAL | 3 refills | Status: DC

## 2017-11-19 NOTE — Progress Notes (Signed)
Subjective:    Patient ID: Tristan Short, male    DOB: 1981-01-05, 37 y.o.   MRN: [id number]  HPI pt is referred by Alysia Penna, NP, for diabetes.  Pt states DM was dx'ed in 2018; he has mild if any neuropathy of the lower extremities; he is unaware of any associated chronic complications; he has never been on insulin; pt says his diet and exercise are not good; he has never had pancreatitis, pancreatic surgery, severe hypoglycemia or DKA.  He was rx'ed 2 oral meds.  He does not check cbg's.   Past Medical History:  Diagnosis Date  . Asthma     Past Surgical History:  Procedure Laterality Date  . FEMUR FRACTURE SURGERY Left    Titanium Rod put in due to fracture. 5 surgeries    Social History   Socioeconomic History  . Marital status: Married    Spouse name: Not on file  . Number of children: 0  . Years of education: BSN  . Highest education level: Not on file  Social Needs  . Financial resource strain: Not on file  . Food insecurity - worry: Not on file  . Food insecurity - inability: Not on file  . Transportation needs - medical: Not on file  . Transportation needs - non-medical: Not on file  Occupational History  . Occupation: Chief Financial Officer: [employer]  Tobacco Use  . Smoking status: Never Smoker  . Smokeless tobacco: Never Used  Substance and Sexual Activity  . Alcohol use: No    Frequency: Never  . Drug use: No  . Sexual activity: Yes  Other Topics Concern  . Not on file  Social History Narrative  . Not on file    Current Outpatient Medications on File Prior to Visit  Medication Sig Dispense Refill  . Cholecalciferol (VITAMIN D) 2000 units tablet Take 1 tablet (2,000 Units total) by mouth daily. 30 tablet 2  . fenofibrate (TRICOR) 145 MG tablet Take 1 tablet (145 mg total) by mouth daily. 90 tablet 0  . glucose blood test strip One Touch strips: Check glucose before breakfast and dinner. 100 each 3  . Lancets (ONETOUCH ULTRASOFT)  lancets Check glucose before breakfast and dinner. 100 each 3  . methylphenidate (RITALIN LA) 30 MG 24 hr capsule   0  . oxymetazoline (AFRIN NASAL SPRAY) 0.05 % nasal spray Place 1 spray into both nostrils 2 (two) times daily as needed for congestion. For nosebleeds 30 mL 0  . sodium chloride (OCEAN) 0.65 % SOLN nasal spray Place 1 spray into both nostrils as needed for congestion. 15 mL 0   No current facility-administered medications on file prior to visit.     No Known Allergies  Family History  Adopted: Yes  Family history unknown: Yes    BP (!) 144/86   Pulse 98   Ht 5\' 9"  (1.753 m)   Wt 262 lb (118.8 kg)   SpO2 97%   BMI 38.69 kg/m    Review of Systems denies blurry vision, headache, chest pain, sob, n/v, urinary frequency, muscle cramps, excessive diaphoresis, cold intolerance, rhinorrhea, and easy bruising.  He has weight gain and mild depression.     Objective:   Physical Exam VS: see vs page GEN: no distress HEAD: head: no deformity eyes: no periorbital swelling, no proptosis external nose and ears are normal mouth: no lesion seen NECK: supple, thyroid is not enlarged CHEST WALL: no deformity LUNGS: clear to auscultation CV: reg  rate and rhythm, no murmur ABD: abdomen is soft, nontender.  no hepatosplenomegaly.  not distended.  no hernia MUSCULOSKELETAL: muscle bulk and strength are grossly normal.  no obvious joint swelling.  gait is normal and steady EXTEMITIES: no deformity.  no ulcer on the feet.  feet are of normal color and temp.  no edema PULSES: dorsalis pedis intact bilat.  no carotid bruit NEURO:  cn 2-12 grossly intact.   readily moves all 4's.  sensation is intact to touch on the feet SKIN:  Normal texture and temperature.  No rash or suspicious lesion is visible.   NODES:  None palpable at the neck PSYCH: alert, well-oriented.  Does not appear anxious nor depressed.   Lab Results  Component Value Date   HGBA1C 6.3 11/19/2017   I have  reviewed outside records, and summarized: Pt was noted to have DM, and referred here.  Main prob addressed was sinusitis.  Metformin was changed to -XR, due to GI sxs.      Assessment & Plan:  Obesity, new to me Type 2 DM: overcontrolled, given this SU-containing regimen.  HTN: is noted today  Patient Instructions  Your blood pressure is high today.  Please see your primary care provider soon, to have it rechecked good diet and exercise significantly improve the control of your diabetes.  please let me know if you wish to be referred to a dietician.  high blood sugar is very risky to your health.  you should see an eye doctor and dentist every year.  It is very important to get all recommended vaccinations.  Controlling your blood pressure and cholesterol drastically reduces the damage diabetes does to your body.  Those who smoke should quit.  Please discuss these with your doctor.  check your blood sugar twice a day.  vary the time of day when you check, between before the 3 meals, and at bedtime.  also check if you have symptoms of your blood sugar being too high or too low.  please keep a record of the readings and bring it to your next appointment here (or you can bring the meter itself).  You can write it on any piece of paper.  please call us sooner if your blood sugar goes below 70, or if you have a lot of readings over 200.   For now, please: Please increase the metformin to 2 pills per day, and:  Change the glyburide to "jardiance."  Please come back for a follow-up appointment in 3 months.     Bariatric Surgery You have so much to gain by losing weight.  You may have already tried every diet and exercise plan imaginable.  And, you may have sought advice from your family physician, too.   Sometimes, in spite of such diligent efforts, you may not be able to achieve long-term results by yourself.  In cases of severe obesity, bariatric or weight loss surgery is a proven method of  achieving long-term weight control.  Our Services Our bariatric surgery programs offer our patients new hope and long-term weight-loss solution.  Since introducing our services in 2003, we have conducted more than 2,400 successful procedures.  Our program is designated as a Investment banker, corporateComprehensive Center by the Metabolic and Bariatric Surgery Accreditation and Quality Improvement Program (MBSAQIP), a Child psychotherapistnational accrediting body that sets rigorous patient safety and outcome standards.  Our program is also designated as a Engineer, manufacturing systemsCenter of Excellence by [employer] insurance companies.   Our exceptional weight-loss surgery team specializes in diagnosis,  treatment, follow-up care, and ongoing support for our patients with severe weight loss challenges.  We currently offer laparoscopic sleeve gastrectomy, gastric bypass, and adjustable gastric band (LAP-BAND).    Attend our [place] Choosing to undergo a bariatric procedure is a big decision, and one that should not be taken lightly.  You now have two options in how you learn about weight-loss surgery - in person or online.  Our objective is to ensure you have all of the information that you need to evaluate the advantages and obligations of this life changing procedure.  Please note that you are not alone in this process, and our experienced team is ready to assist and answer all of your questions.  There are several ways to register for a seminar (either on-line or in person): 1)  Call [phone] 2) Go on-line to [facility] and register for either type of seminar.  [email or web address]

## 2017-11-19 NOTE — Patient Instructions (Addendum)
Your blood pressure is high today.  Please see your primary care provider soon, to have it rechecked good diet and exercise significantly improve the control of your diabetes.  please let me know if you wish to be referred to a dietician.  high blood sugar is very risky to your health.  you should see an eye doctor and dentist every year.  It is very important to get all recommended vaccinations.  Controlling your blood pressure and cholesterol drastically reduces the damage diabetes does to your body.  Those who smoke should quit.  Please discuss these with your doctor.  check your blood sugar twice a day.  vary the time of day when you check, between before the 3 meals, and at bedtime.  also check if you have symptoms of your blood sugar being too high or too low.  please keep a record of the readings and bring it to your next appointment here (or you can bring the meter itself).  You can write it on any piece of paper.  please call us sooner if your blood sugar goes below 70, or if you have a lot of readings over 200.   For now, please: Please increase the metformin to 2 pills per day, and:  Change the glyburide to "jardiance."  Please come back for a follow-up appointment in 3 months.     Bariatric Surgery You have so much to gain by losing weight.  You may have already tried every diet and exercise plan imaginable.  And, you may have sought advice from your family physician, too.   Sometimes, in spite of such diligent efforts, you may not be able to achieve long-term results by yourself.  In cases of severe obesity, bariatric or weight loss surgery is a proven method of achieving long-term weight control.  Our Services Our bariatric surgery programs offer our patients new hope and long-term weight-loss solution.  Since introducing our services in 2003, we have conducted more than 2,400 successful procedures.  Our program is designated as a Investment banker, corporateComprehensive Center by the Metabolic and Bariatric  Surgery Accreditation and Quality Improvement Program (MBSAQIP), a Child psychotherapistnational accrediting body that sets rigorous patient safety and outcome standards.  Our program is also designated as a Engineer, manufacturing systemsCenter of Excellence by [employer] insurance companies.   Our exceptional weight-loss surgery team specializes in diagnosis, treatment, follow-up care, and ongoing support for our patients with severe weight loss challenges.  We currently offer laparoscopic sleeve gastrectomy, gastric bypass, and adjustable gastric band (LAP-BAND).    Attend our Bariatrics Seminar Choosing to undergo a bariatric procedure is a big decision, and one that should not be taken lightly.  You now have two options in how you learn about weight-loss surgery - in person or online.  Our objective is to ensure you have all of the information that you need to evaluate the advantages and obligations of this life changing procedure.  Please note that you are not alone in this process, and our experienced team is ready to assist and answer all of your questions.  There are several ways to register for a seminar (either on-line or in person): 1)  Call [phone] 2) Go on-line to [facility] and register for either type of seminar.  [email or web address]

## 2017-11-21 ENCOUNTER — Encounter: Payer: Self-pay | Admitting: Endocrinology

## 2018-01-17 ENCOUNTER — Ambulatory Visit: Payer: Managed Care, Other (non HMO) | Admitting: Nurse Practitioner

## 2018-02-20 ENCOUNTER — Ambulatory Visit: Payer: Managed Care, Other (non HMO) | Admitting: Endocrinology

## 2018-02-27 ENCOUNTER — Other Ambulatory Visit: Payer: Self-pay | Admitting: Nurse Practitioner

## 2018-02-27 DIAGNOSIS — E781 Pure hyperglyceridemia: Secondary | ICD-10-CM

## 2018-02-27 NOTE — Telephone Encounter (Signed)
Must see PCP for more refills.

## 2018-03-31 ENCOUNTER — Encounter: Payer: Self-pay | Admitting: Endocrinology

## 2018-05-08 ENCOUNTER — Ambulatory Visit: Payer: Self-pay | Admitting: Endocrinology

## 2018-05-20 ENCOUNTER — Encounter: Payer: Self-pay | Admitting: Endocrinology

## 2018-05-20 ENCOUNTER — Other Ambulatory Visit: Payer: Self-pay | Admitting: Nurse Practitioner

## 2018-05-20 ENCOUNTER — Ambulatory Visit: Payer: 59 | Admitting: Endocrinology

## 2018-05-20 VITALS — BP 138/100 | HR 75 | Ht 69.0 in | Wt 263.0 lb

## 2018-05-20 DIAGNOSIS — E118 Type 2 diabetes mellitus with unspecified complications: Secondary | ICD-10-CM | POA: Diagnosis not present

## 2018-05-20 DIAGNOSIS — E781 Pure hyperglyceridemia: Secondary | ICD-10-CM

## 2018-05-20 LAB — POCT GLYCOSYLATED HEMOGLOBIN (HGB A1C): HEMOGLOBIN A1C: 6.5 % — AB (ref 4.0–5.6)

## 2018-05-20 MED ORDER — EMPAGLIFLOZIN 10 MG PO TABS: 10.0000 mg | ORAL_TABLET | Freq: Every day | ORAL | 3 refills | Status: DC

## 2018-05-20 NOTE — Progress Notes (Signed)
Subjective:    Patient ID: Tristan Short, male    DOB: 1981/07/24, 37 y.o.   MRN: [id number]  HPI Pt returns for f/u of diabetes mellitus: DM type: 2 Dx'ed: 2018 Complications: none  Therapy: metformin DKA: never Severe hypoglycemia: never Pancreatitis: never Pancreatic imaging: never Other: he has never been on insulin Interval history: He takes metformin only.  He brings a record of his cbg's which I have reviewed today. Past Medical History:  Diagnosis Date  . Asthma     Past Surgical History:  Procedure Laterality Date  . FEMUR FRACTURE SURGERY Left    Titanium Rod put in due to fracture. 5 surgeries    Social History   Socioeconomic History  . Marital status: Married    Spouse name: Not on file  . Number of children: 0  . Years of education: BSN  . Highest education level: Not on file  Occupational History  . Occupation: Chief Financial Officer: [employer]  Social Needs  . Financial resource strain: Not on file  . Food insecurity:    Worry: Not on file    Inability: Not on file  . Transportation needs:    Medical: Not on file    Non-medical: Not on file  Tobacco Use  . Smoking status: Never Smoker  . Smokeless tobacco: Never Used  Substance and Sexual Activity  . Alcohol use: No    Frequency: Never  . Drug use: No  . Sexual activity: Yes  Lifestyle  . Physical activity:    Days per week: Not on file    Minutes per session: Not on file  . Stress: Not on file  Relationships  . Social connections:    Talks on phone: Not on file    Gets together: Not on file    Attends religious service: Not on file    Active member of club or organization: Not on file    Attends meetings of clubs or organizations: Not on file    Relationship status: Not on file  . Intimate partner violence:    Fear of current or ex partner: Not on file    Emotionally abused: Not on file    Physically abused: Not on file    Forced sexual activity: Not on file  Other  Topics Concern  . Not on file  Social History Narrative  . Not on file    Current Outpatient Medications on File Prior to Visit  Medication Sig Dispense Refill  . Cholecalciferol (VITAMIN D) 2000 units tablet Take 1 tablet (2,000 Units total) by mouth daily. 30 tablet 2  . glucose blood test strip One Touch strips: Check glucose before breakfast and dinner. 100 each 3  . Lancets (ONETOUCH ULTRASOFT) lancets Check glucose before breakfast and dinner. 100 each 3  . metFORMIN (GLUCOPHAGE-XR) 750 MG 24 hr tablet Take 2 tablets (1,500 mg total) by mouth daily with breakfast. 180 tablet 3  . methylphenidate (RITALIN LA) 30 MG 24 hr capsule   0  . oxymetazoline (AFRIN NASAL SPRAY) 0.05 % nasal spray Place 1 spray into both nostrils 2 (two) times daily as needed for congestion. For nosebleeds 30 mL 0  . sodium chloride (OCEAN) 0.65 % SOLN nasal spray Place 1 spray into both nostrils as needed for congestion. 15 mL 0   No current facility-administered medications on file prior to visit.     No Known Allergies  Family History  Adopted: Yes  Family history unknown: Yes  BP (!) 138/100 (BP Location: Left Arm, Patient Position: Sitting, Cuff Size: Normal)   Pulse 75   Ht 5\' 9"  (1.753 m)   Wt 263 lb (119.3 kg)   SpO2 97%   BMI 38.84 kg/m   Review of Systems He denies hypoglycemia    Objective:   Physical Exam VITAL SIGNS:  See vs page GENERAL: no distress Pulses: foot pulses are intact bilaterally.   MSK: no deformity of the feet or ankles.  CV: no edema of the legs or ankles Skin:  no ulcer on the feet or ankles.  normal color and temp on the feet and ankles Neuro: sensation is intact to touch on the feet and ankles.    Lab Results  Component Value Date   HGBA1C 6.5 (A) 05/20/2018       Assessment & Plan:  Obesity: persistent Type 2 DM: he needs increased rx, if it can be done with a regimen that avoids or minimizes hypoglycemia. HTN: is noted today   Patient  Instructions  Your blood pressure is high today.  Please see your primary care provider soon, to have it rechecked check your blood sugar twice a day.  vary the time of day when you check, between before the 3 meals, and at bedtime.  also check if you have symptoms of your blood sugar being too high or too low.  please keep a record of the readings and bring it to your next appointment here (or you can bring the meter itself).  You can write it on any piece of paper.  please call us sooner if your blood sugar goes below 70, or if you have a lot of readings over 200.   Please continue the same metformin, and: I have sent a prescription to your pharmacy, for the jardiance. Please come back for a follow-up appointment in 4-6 months.     Bariatric Surgery You have so much to gain by losing weight.  You may have already tried every diet and exercise plan imaginable.  And, you may have sought advice from your family physician, too.   Sometimes, in spite of such diligent efforts, you may not be able to achieve long-term results by yourself.  In cases of severe obesity, bariatric or weight loss surgery is a proven method of achieving long-term weight control.  Our Services Our bariatric surgery programs offer our patients new hope and long-term weight-loss solution.  Since introducing our services in 2003, we have conducted more than 2,400 successful procedures.  Our program is designated as a Investment banker, corporateComprehensive Center by the Metabolic and Bariatric Surgery Accreditation and Quality Improvement Program (MBSAQIP), a Child psychotherapistnational accrediting body that sets rigorous patient safety and outcome standards.  Our program is also designated as a Engineer, manufacturing systemsCenter of Excellence by [employer] insurance companies.   Our exceptional weight-loss surgery team specializes in diagnosis, treatment, follow-up care, and ongoing support for our patients with severe weight loss challenges.  We currently offer laparoscopic sleeve gastrectomy, gastric  bypass, and adjustable gastric band (LAP-BAND).    Attend our Bariatrics Seminar Choosing to undergo a bariatric procedure is a big decision, and one that should not be taken lightly.  You now have two options in how you learn about weight-loss surgery - in person or online.  Our objective is to ensure you have all of the information that you need to evaluate the advantages and obligations of this life changing procedure.  Please note that you are not alone in this process, and our experienced  team is ready to assist and answer all of your questions.  There are several ways to register for a seminar (either on-line or in person): 1)  Call [phone] 2) Go on-line to [facility] and register for either type of seminar.  [email or web address]

## 2018-05-20 NOTE — Patient Instructions (Addendum)
Your blood pressure is high today.  Please see your primary care provider soon, to have it rechecked check your blood sugar twice a day.  vary the time of day when you check, between before the 3 meals, and at bedtime.  also check if you have symptoms of your blood sugar being too high or too low.  please keep a record of the readings and bring it to your next appointment here (or you can bring the meter itself).  You can write it on any piece of paper.  please call us sooner if your blood sugar goes below 70, or if you have a lot of readings over 200.   Please continue the same metformin, and: I have sent a prescription to your pharmacy, for the jardiance. Please come back for a follow-up appointment in 4-6 months.     Bariatric Surgery You have so much to gain by losing weight.  You may have already tried every diet and exercise plan imaginable.  And, you may have sought advice from your family physician, too.   Sometimes, in spite of such diligent efforts, you may not be able to achieve long-term results by yourself.  In cases of severe obesity, bariatric or weight loss surgery is a proven method of achieving long-term weight control.  Our Services Our bariatric surgery programs offer our patients new hope and long-term weight-loss solution.  Since introducing our services in 2003, we have conducted more than 2,400 successful procedures.  Our program is designated as a Investment banker, corporateComprehensive Center by the Metabolic and Bariatric Surgery Accreditation and Quality Improvement Program (MBSAQIP), a Child psychotherapistnational accrediting body that sets rigorous patient safety and outcome standards.  Our program is also designated as a Engineer, manufacturing systemsCenter of Excellence by [employer] insurance companies.   Our exceptional weight-loss surgery team specializes in diagnosis, treatment, follow-up care, and ongoing support for our patients with severe weight loss challenges.  We currently offer laparoscopic sleeve gastrectomy, gastric bypass, and  adjustable gastric band (LAP-BAND).    Attend our Bariatrics Seminar Choosing to undergo a bariatric procedure is a big decision, and one that should not be taken lightly.  You now have two options in how you learn about weight-loss surgery - in person or online.  Our objective is to ensure you have all of the information that you need to evaluate the advantages and obligations of this life changing procedure.  Please note that you are not alone in this process, and our experienced team is ready to assist and answer all of your questions.  There are several ways to register for a seminar (either on-line or in person): 1)  Call [phone] 2) Go on-line to [facility] and register for either type of seminar.  [email or web address]

## 2018-08-28 ENCOUNTER — Other Ambulatory Visit: Payer: Self-pay

## 2018-08-28 DIAGNOSIS — E118 Type 2 diabetes mellitus with unspecified complications: Secondary | ICD-10-CM

## 2018-08-28 MED ORDER — METFORMIN HCL ER 750 MG PO TB24: 1500.0000 mg | ORAL_TABLET | Freq: Every day | ORAL | 0 refills | Status: DC

## 2018-12-07 ENCOUNTER — Other Ambulatory Visit: Payer: Self-pay | Admitting: Nurse Practitioner

## 2018-12-07 DIAGNOSIS — E781 Pure hyperglyceridemia: Secondary | ICD-10-CM

## 2019-01-04 ENCOUNTER — Other Ambulatory Visit: Payer: Self-pay | Admitting: Nurse Practitioner

## 2019-01-04 DIAGNOSIS — E118 Type 2 diabetes mellitus with unspecified complications: Secondary | ICD-10-CM

## 2019-01-08 ENCOUNTER — Ambulatory Visit: Payer: BLUE CROSS/BLUE SHIELD

## 2019-01-08 ENCOUNTER — Encounter: Payer: Self-pay | Admitting: Nurse Practitioner

## 2019-01-08 ENCOUNTER — Ambulatory Visit (INDEPENDENT_AMBULATORY_CARE_PROVIDER_SITE_OTHER): Payer: BLUE CROSS/BLUE SHIELD | Admitting: Nurse Practitioner

## 2019-01-08 VITALS — BP 148/86 | HR 98 | Ht 69.0 in | Wt 260.0 lb

## 2019-01-08 DIAGNOSIS — R748 Abnormal levels of other serum enzymes: Secondary | ICD-10-CM | POA: Diagnosis not present

## 2019-01-08 DIAGNOSIS — I10 Essential (primary) hypertension: Secondary | ICD-10-CM | POA: Diagnosis not present

## 2019-01-08 DIAGNOSIS — E782 Mixed hyperlipidemia: Secondary | ICD-10-CM

## 2019-01-08 DIAGNOSIS — E118 Type 2 diabetes mellitus with unspecified complications: Secondary | ICD-10-CM

## 2019-01-08 DIAGNOSIS — E559 Vitamin D deficiency, unspecified: Secondary | ICD-10-CM | POA: Diagnosis not present

## 2019-01-08 LAB — MICROALBUMIN / CREATININE URINE RATIO
Creatinine,U: 135.4 mg/dL
Microalb Creat Ratio: 0.7 mg/g (ref 0.0–30.0)
Microalb, Ur: 1 mg/dL (ref 0.0–1.9)

## 2019-01-08 LAB — HEPATIC FUNCTION PANEL
ALT: 79 U/L — ABNORMAL HIGH (ref 0–53)
AST: 48 U/L — ABNORMAL HIGH (ref 0–37)
Albumin: 4.9 g/dL (ref 3.5–5.2)
Alkaline Phosphatase: 47 U/L (ref 39–117)
Bilirubin, Direct: 0.2 mg/dL (ref 0.0–0.3)
Total Bilirubin: 0.7 mg/dL (ref 0.2–1.2)
Total Protein: 8.5 g/dL — ABNORMAL HIGH (ref 6.0–8.3)

## 2019-01-08 LAB — BASIC METABOLIC PANEL
BUN: 14 mg/dL (ref 6–23)
CO2: 28 mEq/L (ref 19–32)
Calcium: 9.7 mg/dL (ref 8.4–10.5)
Chloride: 101 mEq/L (ref 96–112)
Creatinine, Ser: 1.04 mg/dL (ref 0.40–1.50)
GFR: 79.07 mL/min (ref >60.00)
Glucose, Bld: 103 mg/dL — ABNORMAL HIGH (ref 70–99)
Potassium: 3.6 mEq/L (ref 3.5–5.1)
Sodium: 138 mEq/L (ref 135–145)

## 2019-01-08 LAB — LIPID PANEL
Cholesterol: 186 mg/dL (ref 0–200)
HDL: 34.8 mg/dL — ABNORMAL LOW (ref >39.00)
LDL Cholesterol: 123 mg/dL — ABNORMAL HIGH (ref 0–99)
NonHDL: 150.75
Total CHOL/HDL Ratio: 5
Triglycerides: 141 mg/dL (ref 0.0–149.0)
VLDL: 28.2 mg/dL (ref 0.0–40.0)

## 2019-01-08 LAB — HEMOGLOBIN A1C: Hgb A1c MFr Bld: 5.9 % (ref 4.6–6.5)

## 2019-01-08 MED ORDER — LISINOPRIL 5 MG PO TABS: 5.0000 mg | ORAL_TABLET | Freq: Every day | ORAL | 1 refills | Status: DC

## 2019-01-08 MED ORDER — METFORMIN HCL ER 750 MG PO TB24: 750.0000 mg | ORAL_TABLET | Freq: Every day | ORAL | 1 refills | Status: DC

## 2019-01-08 MED ORDER — ATORVASTATIN CALCIUM 20 MG PO TABS: 20.0000 mg | ORAL_TABLET | Freq: Every day | ORAL | 1 refills | Status: DC

## 2019-01-08 NOTE — Assessment & Plan Note (Signed)
Add lisinopril 5mg  F/up in 58months

## 2019-01-08 NOTE — Assessment & Plan Note (Signed)
Controlled with hgbA1c of 5.9. Maintain jardiance and decrease metformin to 750mg  once a day.  F/up in 65months

## 2019-01-08 NOTE — Assessment & Plan Note (Signed)
Improved triglyceride, but elevated LDL and low HDL. Advised about need for exercise and DASH diet.  D/c fenofibrate Start atovastatin. F/up in 80months

## 2019-01-08 NOTE — Patient Instructions (Signed)
Normal urine microalbumin and Dm controlled with hgbA1c of 5.9. Persistent elevation in liver enzymes. Need to f/up with GI and repeat CT and previously discussed. Let me know if you want orders entered. Normal renal function. Lipid panel indicates: low HDL and high LDL. Need to exercise regularly and maintain DASH diet to help improve these numbers Lisinopril sent for HTN. F/up in 46months

## 2019-01-08 NOTE — Progress Notes (Signed)
Virtual Visit via Video Note  I connected with Tristan Short on 01/08/19 at  9:15 AM EDT by a video enabled telemedicine application and verified that I am speaking with the correct person using two identifiers.   I discussed the limitations of evaluation and management by telemedicine and the availability of in person appointments. The patient expressed understanding and agreed to proceed.  CC: medication refill/follow up--90 days supply/ glucose testing kit consult?  History of Present Illness:  HTN: Persistent elevation at 140s/80s. Has not made any changes to diet, does not exercise. BP Readings from Last 3 Encounters:  01/08/19 (!) 148/86  05/20/18 (!) 138/100  11/19/17 (!) 144/86   DM: Current use of jardiance only. Reports he has been out of metformin. Does not check glucose at home. Last hgbA1c of 6.5 (04/2018) Last appt with Dr. Loanne Drilling 04/2018. Needs annual eye exam.  Reports increased anxiety with current change in work environment. He has to to work from home due to [place] pandemic. Depression screen [facility] 2/9 01/08/2019 09/09/2017 09/09/2017  Decreased Interest 0 1 1  Down, Depressed, Hopeless 0 0 1  PHQ - 2 Score 0 1 2  Altered sleeping 2 3 -  Tired, decreased energy 0 2 -  Change in appetite 0 3 -  Feeling bad or failure about yourself  0 0 -  Trouble concentrating 0 3 -  Moving slowly or fidgety/restless 0 0 -  Suicidal thoughts 0 0 -  PHQ-9 Score - 12 -  Difficult doing work/chores Somewhat difficult - -   GAD 7 : Generalized Anxiety Score 01/08/2019 09/09/2017  Nervous, Anxious, on Edge 0 0  Control/stop worrying 1 0  Worry too much - different things 1 0  Trouble relaxing 0 0  Restless 0 0  Easily annoyed or irritable 1 0  Afraid - awful might happen 1 0  Total GAD 7 Score 4 0  Anxiety Difficulty Somewhat difficult -   Observations/Objective: Physical Exam  Constitutional: He is oriented to person, place, and time. No distress.  Neck: Normal  range of motion. Neck supple.  Pulmonary/Chest: Effort normal.  Neurological: He is alert and oriented to person, place, and time.  Psychiatric: He has a normal mood and affect. His behavior is normal. Thought content normal.  Vitals reviewed.  Assessment and Plan: Tristan Short was seen today for hypertension and hyperlipidemia.  Diagnoses and all orders for this visit:  Essential hypertension -     Basic metabolic panel; Future -     Basic metabolic panel -     lisinopril (ZESTRIL) 5 MG tablet; Take 1 tablet (5 mg total) by mouth daily.  Vitamin D deficiency -     Vitamin D 1,25 dihydroxy; Future -     Vitamin D 1,25 dihydroxy  Mixed hyperlipidemia -     Lipid panel; Future -     Hepatic function panel; Future -     Hepatic function panel -     Lipid panel -     atorvastatin (LIPITOR) 20 MG tablet; Take 1 tablet (20 mg total) by mouth daily at 6 PM.  Elevated liver enzymes -     Hepatic function panel; Future -     Hepatic function panel  Type 2 diabetes mellitus with complication, without long-term current use of insulin (HCC) -     Basic metabolic panel; Future -     Hemoglobin A1c; Future -     Microalbumin / creatinine urine ratio; Future -  Microalbumin / creatinine urine ratio -     Hemoglobin A1c -     Basic metabolic panel -     metFORMIN (GLUCOPHAGE-XR) 750 MG 24 hr tablet; Take 1 tablet (750 mg total) by mouth daily with breakfast.   Follow Up Instructions: Normal urine microalbumin and Dm controlled with hgbA1c of 5.9. Persistent elevation in liver enzymes. Need to f/up with GI and repeat CT and previously discussed. Let me know if you want orders entered. Normal renal function. Lipid panel indicates: low HDL and high LDL. Need to exercise regularly and maintain DASH diet to help improve these numbers. Change fenofibrate to atorvastatin. New rx sent. Lisinopril sent for HTN. F/up in 49month   I discussed the assessment and treatment plan with the patient.  The patient was provided an opportunity to ask questions and all were answered. The patient agreed with the plan and demonstrated an understanding of the instructions.   The patient was advised to call back or seek an in-person evaluation if the symptoms worsen or if the condition fails to improve as anticipated.   CWilfred Lacy NP

## 2019-01-12 NOTE — Addendum Note (Signed)
Addended by: Alysia Penna L on: 01/12/2019 12:03 PM   Modules accepted: Orders

## 2019-01-13 LAB — VITAMIN D 1,25 DIHYDROXY
Vitamin D 1, 25 (OH)2 Total: 67 pg/mL (ref 18–72)
Vitamin D2 1, 25 (OH)2: <8 pg/mL
Vitamin D3 1, 25 (OH)2: 67 pg/mL

## 2019-02-05 ENCOUNTER — Other Ambulatory Visit: Payer: Self-pay | Admitting: Nurse Practitioner

## 2019-02-05 DIAGNOSIS — E118 Type 2 diabetes mellitus with unspecified complications: Secondary | ICD-10-CM

## 2019-02-10 ENCOUNTER — Other Ambulatory Visit: Payer: Self-pay | Admitting: Nurse Practitioner

## 2019-02-10 NOTE — Telephone Encounter (Signed)
Claris Gower do you want to send in rx or have doctor Everardo All office manage this med (we never send it in before)?

## 2019-03-02 ENCOUNTER — Other Ambulatory Visit: Payer: Self-pay | Admitting: Nurse Practitioner

## 2019-03-02 DIAGNOSIS — E781 Pure hyperglyceridemia: Secondary | ICD-10-CM

## 2019-03-31 ENCOUNTER — Telehealth: Payer: Self-pay | Admitting: Nurse Practitioner

## 2019-03-31 ENCOUNTER — Other Ambulatory Visit: Payer: Self-pay | Admitting: Nurse Practitioner

## 2019-03-31 DIAGNOSIS — E118 Type 2 diabetes mellitus with unspecified complications: Secondary | ICD-10-CM

## 2019-03-31 MED ORDER — METFORMIN HCL 500 MG PO TABS: 500.0000 mg | ORAL_TABLET | Freq: Two times a day (BID) | ORAL | 1 refills | Status: AC

## 2019-03-31 NOTE — Telephone Encounter (Signed)
Pt is aware.  

## 2019-03-31 NOTE — Telephone Encounter (Signed)
Inform patient of medication recall. Metformin XR 750mg  changed to Metformin 500mg  BID. Needs to schedule F/up appt for additional refills

## 2019-04-13 ENCOUNTER — Ambulatory Visit: Payer: BLUE CROSS/BLUE SHIELD | Admitting: Nurse Practitioner

## 2019-04-26 IMAGING — US US ABDOMEN LIMITED
1 series · 14 of 25 positions shown · non-contrast
Comparison: None available

CLINICAL DATA: Elevated LFTs

EXAM:
ULTRASOUND ABDOMEN LIMITED RIGHT UPPER QUADRANT

[Series 1: us abdomen limited · 0.28mm/px · 14 of 46 slices shown]
[im 1/46]
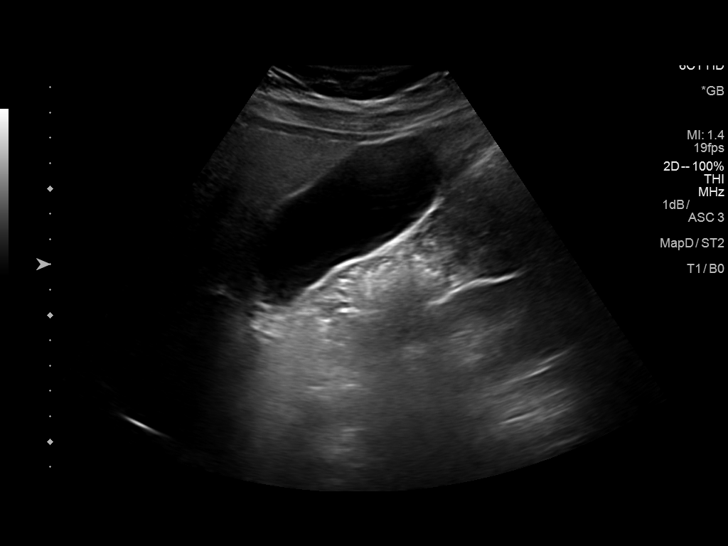
[im 4/46]
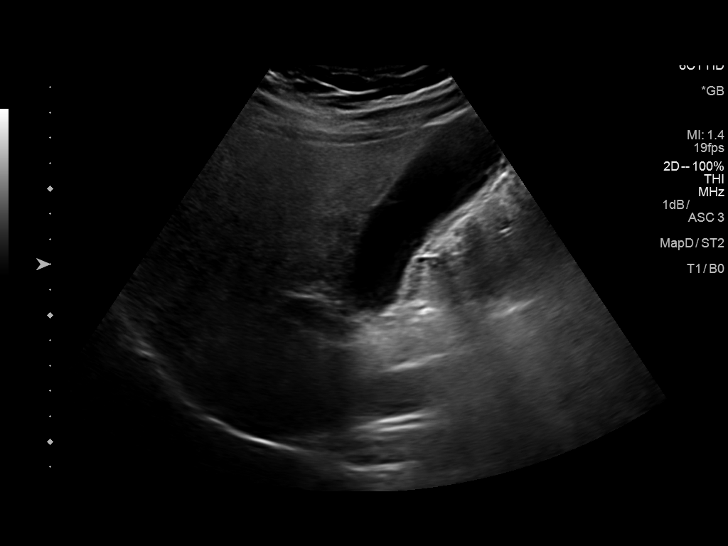
[im 8/46]
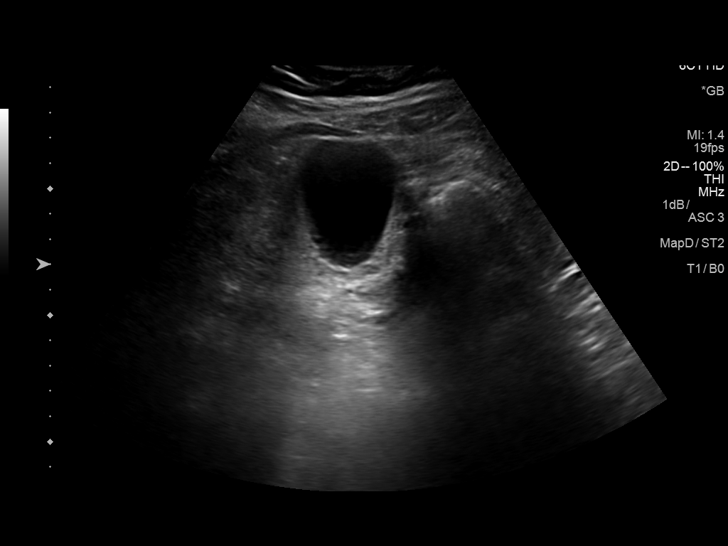
[im 12/46]
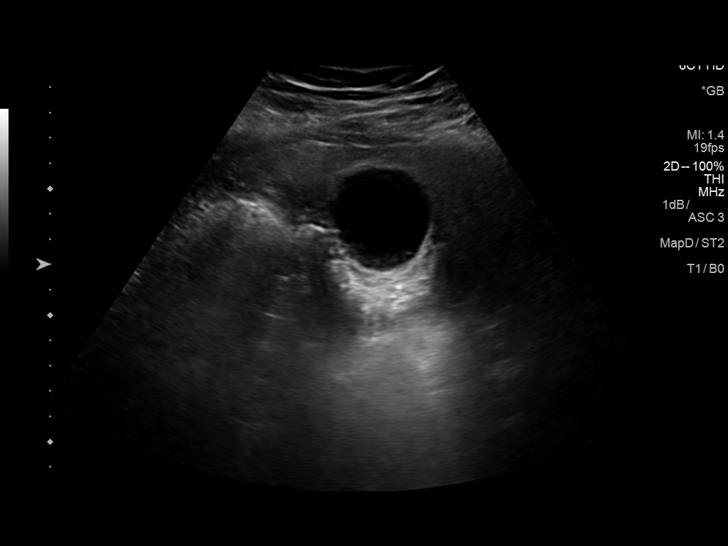
[im 16/46]
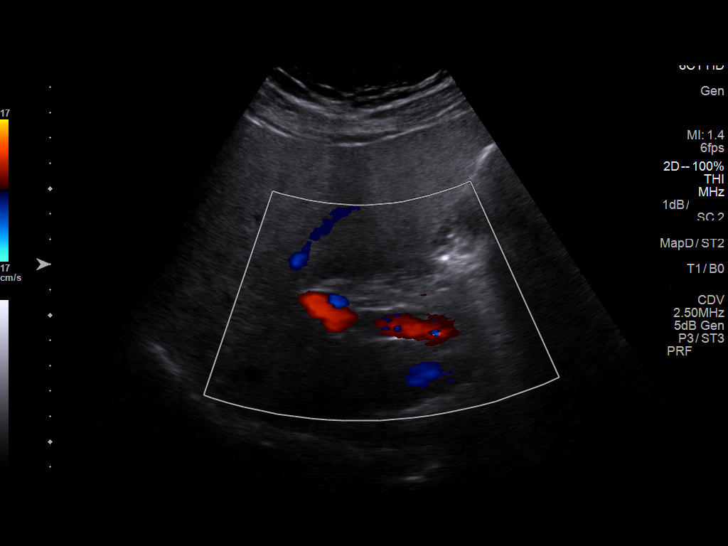
[im 17/46]
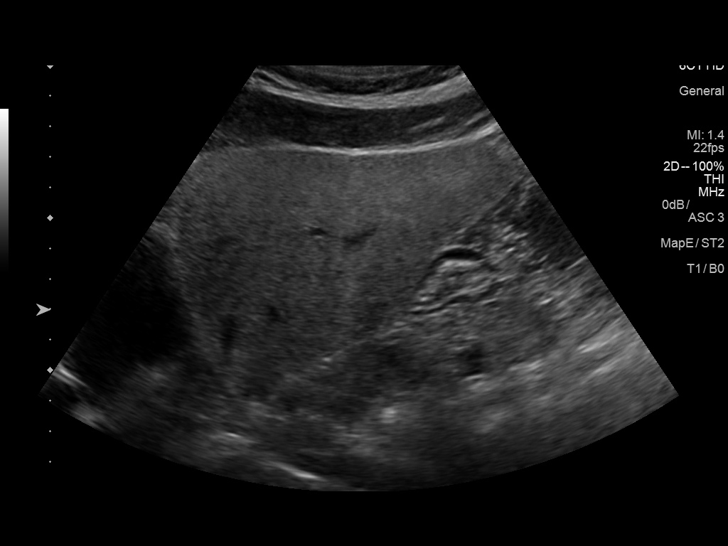
[im 21/46]
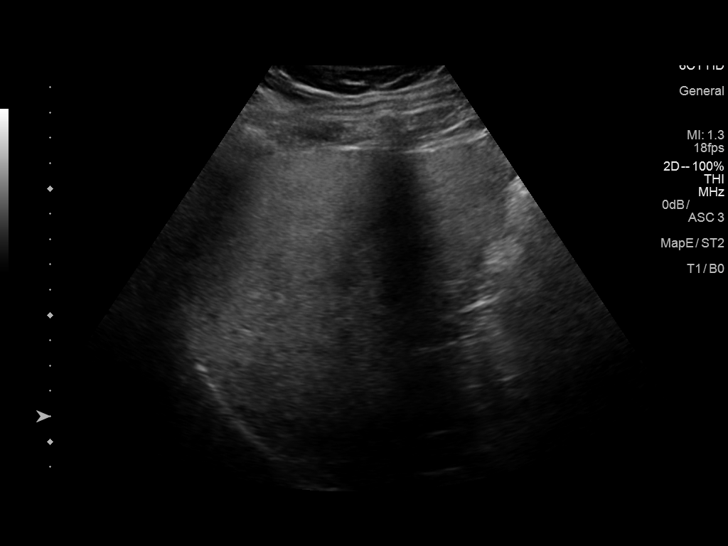
[im 25/46]
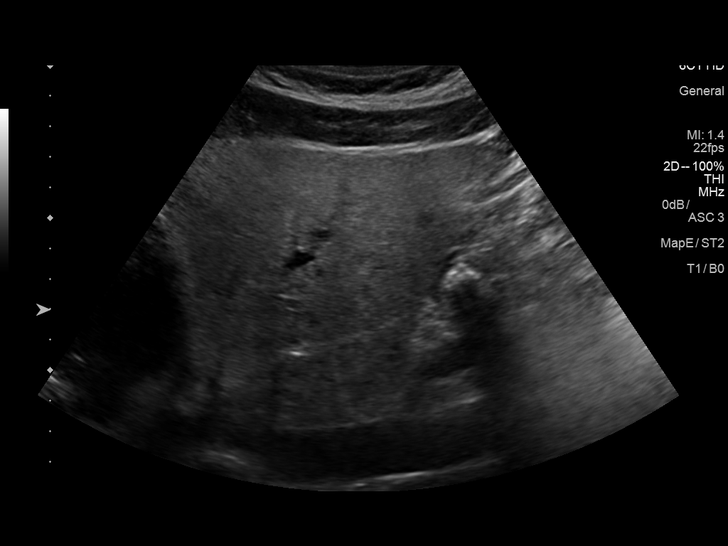
[im 29/46]
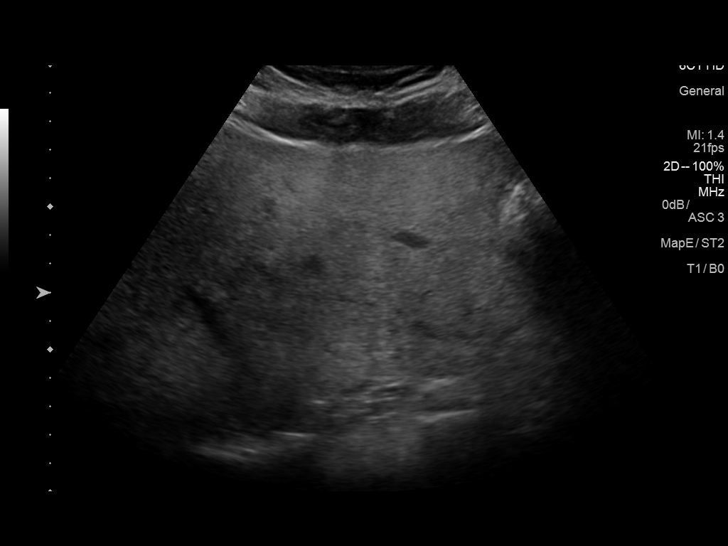
[im 31/46]
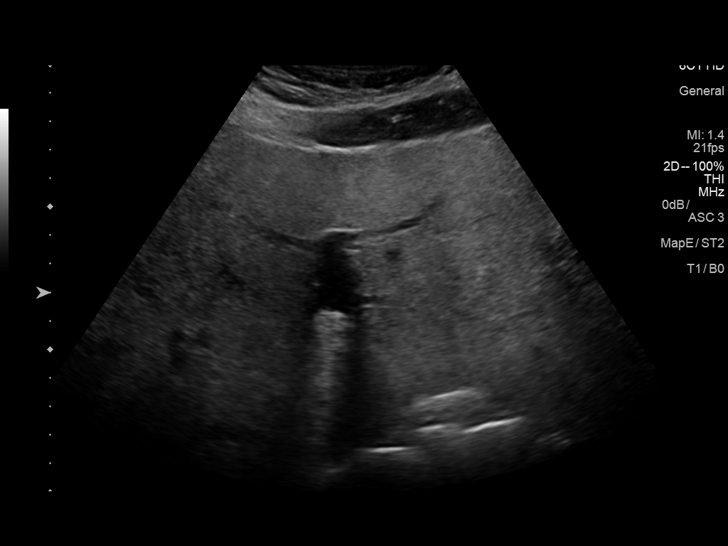
[im 34/46]
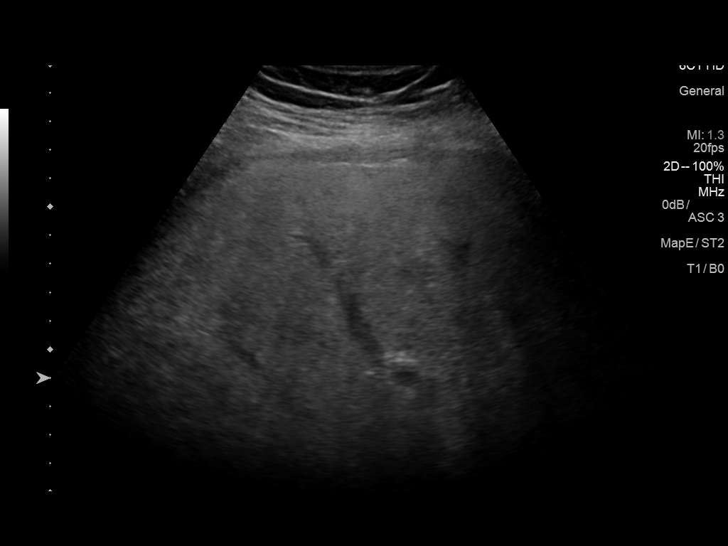
[im 38/46]
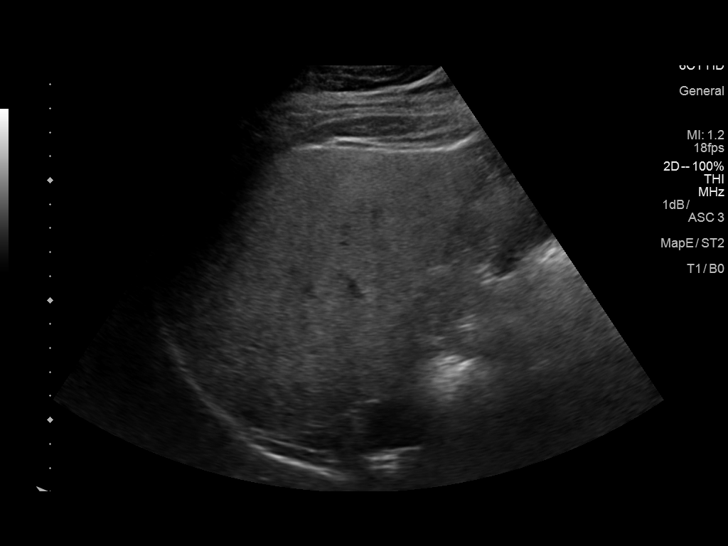
[im 42/46]
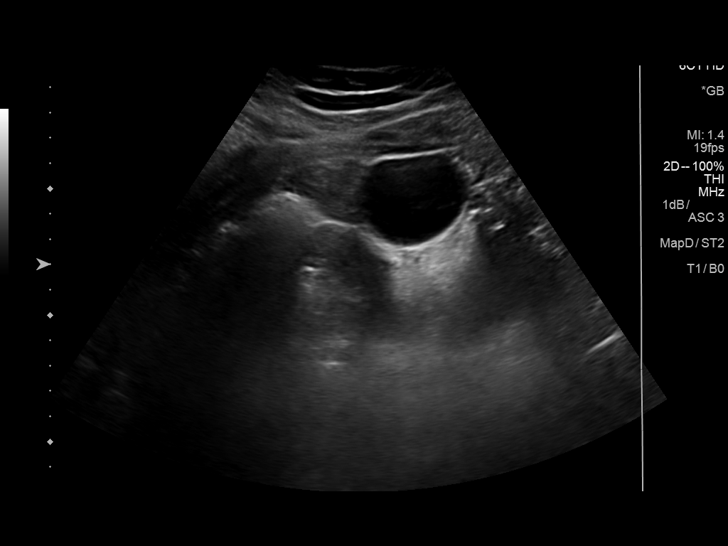
[im 46/46]
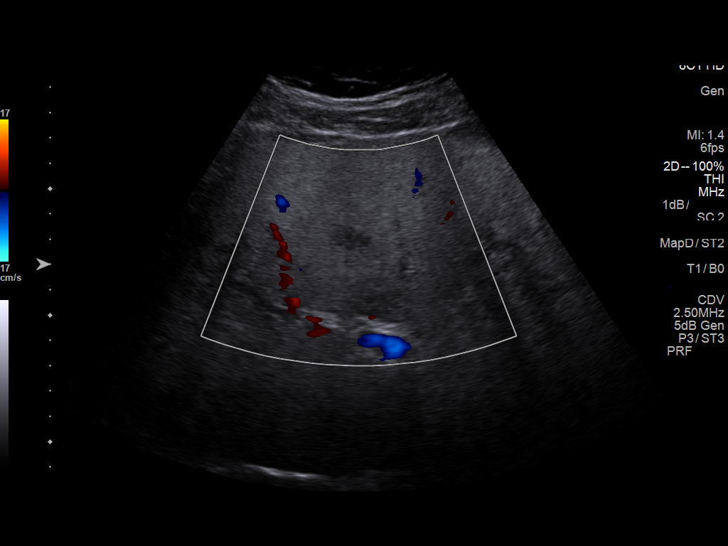

[14 of 25 positions shown; findings below may reference images not displayed]

FINDINGS: Gallbladder:

No gallstones or wall thickening visualized. No sonographic Murphy
sign noted by sonographer.

Common bile duct:

Diameter: 4.6 mm

Liver:

Diffuse increased hepatic echogenicity suggesting fatty
infiltration. No focal hepatic abnormality or intrahepatic biliary
dilatation.

No surrounding free fluid or ascites.

Portal vein is patent on color Doppler imaging with normal direction
of blood flow towards the liver.
IMPRESSION: Negative for gallstones or acute finding.

No biliary dilatation or obstruction

Hepatic steatosis

## 2019-06-02 ENCOUNTER — Other Ambulatory Visit: Payer: Self-pay | Admitting: Nurse Practitioner

## 2019-07-09 ENCOUNTER — Other Ambulatory Visit: Payer: Self-pay | Admitting: Nurse Practitioner

## 2019-07-09 DIAGNOSIS — E782 Mixed hyperlipidemia: Secondary | ICD-10-CM

## 2019-07-09 DIAGNOSIS — I10 Essential (primary) hypertension: Secondary | ICD-10-CM

## 2020-02-10 ENCOUNTER — Other Ambulatory Visit: Payer: Self-pay | Admitting: Nurse Practitioner

## 2020-02-10 DIAGNOSIS — I10 Essential (primary) hypertension: Secondary | ICD-10-CM

## 2020-02-10 DIAGNOSIS — E782 Mixed hyperlipidemia: Secondary | ICD-10-CM
